# Patient Record
Sex: Female | Born: 1970 | Race: White | Hispanic: No | Marital: Married | Smoking: Never smoker
Health system: Southern US, Community
[De-identification: ages and names within clinical notes are randomized; demographics above are authoritative.]

## PROBLEM LIST (undated history)

## (undated) DIAGNOSIS — R221 Localized swelling, mass and lump, neck: Secondary | ICD-10-CM

## (undated) HISTORY — PX: D&C WITH HYSTEROSCOPY: SHX510231

## (undated) HISTORY — PX: WISDOM TOOTH EXTRACTION: SHX21

---

## 2006-10-24 IMAGING — US US INTRAOPERATIVE - NO REPORT
1 series · 2 of 2 positions shown · non-contrast
Comparison: none

[Series 1: us intraoperative - no report · 2 acquisitions, 2 frames shown]
[im 1/2]
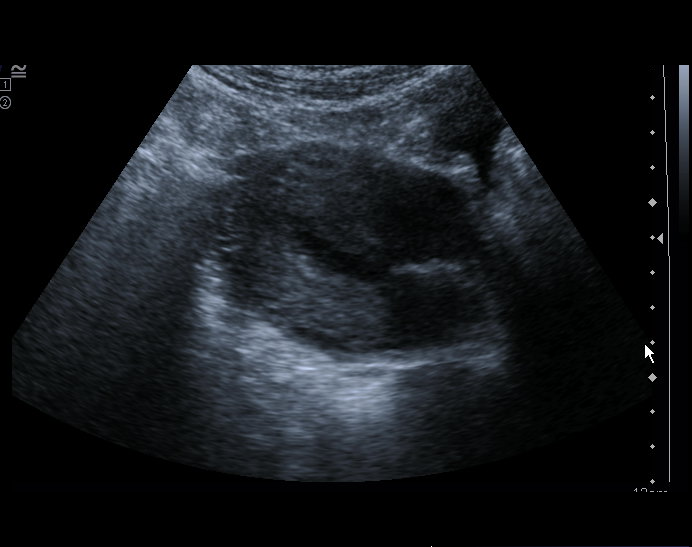
[im 2/2]
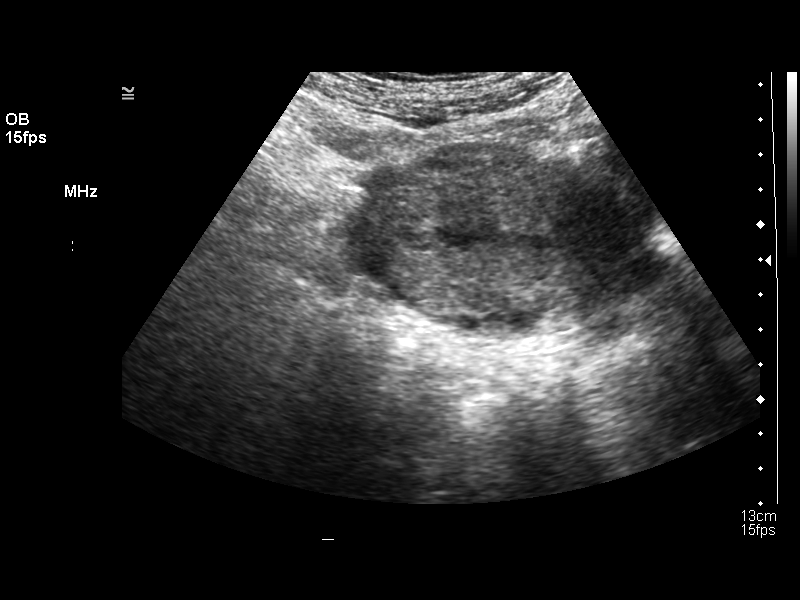

[2 of 2 positions shown; findings below may reference images not displayed]

ULTRASOUND INTRAOPERATIVE:

 Ultrasound was utilized in the operating room by the requesting physician.

## 2015-08-03 NOTE — ED Provider Notes (Signed)
[facility]     Time of Arrival:  08/03/2015 12:49 AM      (S01.511A) Lip laceration, initial encounter    (S02.2XXA) Closed fracture of nasal bone, initial encounter    ED Course/Medical Decision Making:  45 year old otherwise healthy woman who presents after slipping on hardwood floors with a stellate laceration of her upper lip and a suspected nondisplaced nasal bone fracture. We will cleanse and repair the lip laceration, have her follow-up with plastics as needed. UTD with her tetanus.    Disposition:  Home    Laceration Repair  Date/Time: 08/03/2015 1:40 AM  Performed by: Elayne Snare  Authorized by: Christophe Louis E  Consent: Verbal consent obtained. Written consent not obtained.  Risks and benefits: risks, benefits and alternatives were discussed  Consent given by: Patient  Patient identity confirmed: Verbally with patient  Body area: head/neck  Location details: upper lip  Full thickness lip laceration: yes  Vermillion border involved: no  Lip laceration height: up to half vertical height  Laceration length: 4 cm  Foreign bodies: no foreign bodies  Tendon involvement: none  Nerve involvement: none  Vascular damage: no  Anesthesia: nerve block  Local anesthetic: lidocaine 1% with epinephrine  Irrigation solution: saline  Irrigation method: syringe  Amount of cleaning: standard  Debridement: none  Degree of undermining: none  Subcutaneous closure: 5-0 Vicryl  Number of sutures: 4  Technique: complex  Approximation: close  Approximation difficulty: complex  Patient tolerance: Patient tolerated the procedure well with no immediate complications        Discharge Medication List as of 08/03/2015  1:38 AM      START taking these medications    Details   penicillin V potassium (VEETID) 500 mg PO TABS Take 1 Tab by Mouth 4 Times Daily for 5 days., Disp-20 Tab, R-0, Print           Chief Complaint   Patient presents with   . LIP LACERATION     HPI Lindsay Duncan is a 45 y.o.  female is reportedly healthy presents to the emergency department with lip laceration. She was that she was walking at home and slipped on a hardwood floor, falling onto her face. She did not have loss of consciousness. She complains of a laceration to the upper lip that causes pain. Bleeding is controlled. She is up-to-date with her tetanus shot. She denies any dental injury. She is wearing glasses. The bridge of her nose swollen and sore as well.    Review of Systems:  Constitutional: Negative for fever and chills.   Eyes: Negative for visual disturbance.   Respiratory: Negative for cough and shortness of breath.    Cardiovascular: Negative for chest pain and palpitations.   Gastrointestinal: Negative for nausea, vomiting, abdominal pain and diarrhea.   Musculoskeletal: Negative for arthralgias and gait problem.   Skin: Negative for rash.   Neurological: Negative for dizziness, weakness and numbness.   Hematological: does not bruise/bleed easily.   Psychiatric/Behavioral: Negative for sleep disturbance.       History reviewed. No pertinent past medical history.  History reviewed. No pertinent past surgical history.  No family history on file.  Social History     Social History   . Marital Status: Married     Spouse Name: N/A   . Number of Children: N/A   . Years of Education: N/A     Occupational History   . Not on file.     Social  History Main Topics   . Smoking status: Never Smoker    . Smokeless tobacco: Not on file   . Alcohol Use: 10.5 oz/week     0 Standard drinks or equivalent, 21 Glasses of wine per week   . Drug Use: No   . Sexual Activity: Not on file     Other Topics Concern   . Not on file     Social History Narrative     No outpatient prescriptions have been marked as taking for the 08/03/15 encounter [facility] Encounter).     No Known Allergies    Vital Signs:  Patient Vitals for the past 72 hrs:   Heart Rate Resp BP BP Mean SpO2 Weight   08/03/15 0050 98 16 172/67 mmHg 102 MM HG 99 % 66.225 kg (146  lb)       Physical Exam   Constitutional: She is oriented to person, place, and time and well-developed, well-nourished, and in no distress. No distress.   HENT:   Head: Normocephalic and atraumatic.   Mouth/Throat: Oropharynx is clear and moist.   Nose with edema, tenderness to bridge, no septal hematoma or asymmetry. Stellate laceration to upper lip. No dental injury.   Eyes: Conjunctivae and EOM are normal. Pupils are equal, round, and reactive to light. Right eye exhibits no discharge. Left eye exhibits no discharge. No scleral icterus.   Neck: Normal range of motion. Neck supple. No JVD present. No tracheal deviation present. No thyromegaly present.   Cardiovascular: Normal rate, regular rhythm, normal heart sounds and intact distal pulses.  Exam reveals no gallop and no friction rub.    No murmur heard.  Pulmonary/Chest: Effort normal and breath sounds normal. No stridor. No respiratory distress. She has no wheezes. She has no rales. She exhibits no tenderness.   Abdominal: Soft. Bowel sounds are normal. She exhibits no distension and no mass. There is no tenderness. There is no rebound and no guarding.   Musculoskeletal: Normal range of motion. She exhibits no edema or tenderness.   Neurological: She is alert and oriented to person, place, and time. Gait normal. GCS score is 15.   Skin: Skin is warm and dry. No rash noted. She is not diaphoretic. No erythema. No pallor.   Stellate laceration to the upper lip, deep   Psychiatric: Mood and affect normal.   Nursing note and vitals reviewed.      Diagnostics:  Labs:  No results found for this visit on 08/03/15.  No orders to display

## 2021-03-24 ENCOUNTER — Other Ambulatory Visit: Payer: Self-pay | Admitting: Specialist

## 2022-03-25 ENCOUNTER — Other Ambulatory Visit: Payer: Self-pay | Admitting: Family Nurse Practitioner

## 2022-07-15 NOTE — Progress Notes (Signed)
I attest that I have spent 8 - 15 minutes reviewing the TB Assessment Form and counseling the patient about Tuberculosis.

## 2022-07-15 NOTE — Progress Notes (Signed)
Subjective:   Patient ID: Lindsay Duncan is a 52 y.o. female.    Chief Complaint   Patient presents with   . TB Risk Assessment Form         HPI   TB Risk Assessment Form  Reason for TB Assessment::  Pre-employment  Did patient provide form?: No    Does the patient have any symptoms of TB?: No    Does the patient have a history of abnormal chest x-ray?: No    Was the patient born outside of the [place] in a country with high TB rate?: No    Received BCG vaccine?: No    Any recent travel to country with high TB rate for more than a week?: No    Has the patient been exposed to any one with a positive TB skin test, TB disease or taking medications for TB?: No    Has the patient been in close contact with someone born in or traveled to a country with high TB rate?: No    Does patient have immunosuppression due to medications or disease processes?: No    Is the patient an IV drug user?: No    Has the patient ever lived/worked in a prison, jail, homeless shelter or long-term care facility?: No    Does the patient drink raw milk or eat unpasteurized cheese?: No    Does the patient have/suspected to have HIV infection or live with an adult with HIV seropositivity?: No    Risk Assesment positive?: No    Patient able to complete PPD today?: No    Contraindications for TB Screening::  None  Have you ever fainted, nearly fainted or been concerned about fainting after receiving an injection/vaccine?: No             Review of Systems    Social History     Tobacco Use   Smoking Status Not on file   Smokeless Tobacco Not on file     No past medical history on file.  No past surgical history on file.  No family history on file.  Objective:          Physical Exam       Assessment/Plan:    HPI provided by Self    Based on today's visit:history, physical exam and all relevant testing completed in clinic today  patient's visit diagnosis is/includes   1. Screening-pulmonary TB      Patient does not have a history of chronic  conditions.    Treatment plan includes:   Orders Placed:  Orders Placed This Encounter   Procedures   . Basic Health Screening     Medications ordered this visit      No prescriptions requested or ordered in this encounter       Current medication list and any new medications prescribed or recommended today were reviewed with the patient and specific instructions were provided Yes    Provider Recommendations     TB risk assessment screening completed. Results and recommendations reviewed with patient in accordance with [facility] guidelines.     Follow up care instructions were provided and reviewed?with the  Patient. All questions were answered. Patient verbalized understanding of plan of care today.

## 2022-09-04 ENCOUNTER — Emergency Department: Payer: Commercial Managed Care - POS

## 2022-09-04 ENCOUNTER — Emergency Department
Admission: EM | Admit: 2022-09-04 | Discharge: 2022-09-04 | Disposition: A | Discharge status: Home / Self Care | Payer: Commercial Managed Care - POS | Attending: Emergency Medicine | Admitting: Emergency Medicine

## 2022-09-04 DIAGNOSIS — I10 Essential (primary) hypertension: Secondary | ICD-10-CM | POA: Insufficient documentation

## 2022-09-04 DIAGNOSIS — R911 Solitary pulmonary nodule: Secondary | ICD-10-CM

## 2022-09-04 DIAGNOSIS — I493 Ventricular premature depolarization: Secondary | ICD-10-CM | POA: Insufficient documentation

## 2022-09-04 DIAGNOSIS — R9431 Abnormal electrocardiogram [ECG] [EKG]: Secondary | ICD-10-CM

## 2022-09-04 DIAGNOSIS — U071 COVID-19: Secondary | ICD-10-CM

## 2022-09-04 DIAGNOSIS — H10503 Unspecified blepharoconjunctivitis, bilateral: Secondary | ICD-10-CM

## 2022-09-04 HISTORY — DX: Localized swelling, mass and lump, neck: R22.1

## 2022-09-04 LAB — COMPREHENSIVE METABOLIC PANEL
ALT: 32 U/L (ref 0–55)
AST (SGOT): 31 U/L (ref 5–41)
Albumin/Globulin Ratio: 1 (ref 0.9–2.2)
Albumin: 3.9 g/dL (ref 3.5–5.0)
Alkaline Phosphatase: 146 U/L — ABNORMAL HIGH (ref 37–117)
Anion Gap: 9 (ref 5.0–15.0)
BUN: 10 mg/dL (ref 7.0–21.0)
Bilirubin, Total: 0.3 mg/dL (ref 0.2–1.2)
CO2: 25 mEq/L (ref 17–29)
Calcium: 8.6 mg/dL (ref 8.5–10.5)
Chloride: 104 mEq/L (ref 99–111)
Creatinine: 0.8 mg/dL (ref 0.4–1.0)
Globulin: 4 g/dL — ABNORMAL HIGH (ref 2.0–3.6)
Glucose: 89 mg/dL (ref 70–100)
Potassium: 4 mEq/L (ref 3.5–5.3)
Protein, Total: 7.9 g/dL (ref 6.0–8.3)
Sodium: 138 mEq/L (ref 135–145)
eGFR: >60 mL/min/{1.73_m2} (ref >=60)

## 2022-09-04 LAB — CBC AND DIFFERENTIAL
Absolute NRBC: 0 10*3/uL (ref 0.00–0.00)
Basophils Absolute Automated: 0.07 10*3/uL (ref 0.00–0.08)
Basophils Automated: 0.7 %
Eosinophils Absolute Automated: 0.13 10*3/uL (ref 0.00–0.44)
Eosinophils Automated: 1.3 %
Hematocrit: 39.1 % (ref 34.7–43.7)
Hgb: 13.2 g/dL (ref 11.4–14.8)
Immature Granulocytes Absolute: 0.06 10*3/uL (ref 0.00–0.07)
Immature Granulocytes: 0.6 %
Instrument Absolute Neutrophil Count: 7.62 10*3/uL — ABNORMAL HIGH (ref 1.10–6.33)
Lymphocytes Absolute Automated: 1.55 10*3/uL (ref 0.42–3.22)
Lymphocytes Automated: 14.9 %
MCH: 30.1 pg (ref 25.1–33.5)
MCHC: 33.8 g/dL (ref 31.5–35.8)
MCV: 89.3 fL (ref 78.0–96.0)
MPV: 10.5 fL (ref 8.9–12.5)
Monocytes Absolute Automated: 0.97 10*3/uL — ABNORMAL HIGH (ref 0.21–0.85)
Monocytes: 9.3 %
Neutrophils Absolute: 7.62 10*3/uL — ABNORMAL HIGH (ref 1.10–6.33)
Neutrophils: 73.2 %
Nucleated RBC: 0 /100 WBC (ref 0.0–0.0)
Platelets: 248 10*3/uL (ref 142–346)
RBC: 4.38 10*6/uL (ref 3.90–5.10)
RDW: 12 % (ref 11–15)
WBC: 10.4 10*3/uL — ABNORMAL HIGH (ref 3.10–9.50)

## 2022-09-04 LAB — MAGNESIUM: Magnesium: 1.8 mg/dL (ref 1.6–2.6)

## 2022-09-04 LAB — C-REACTIVE PROTEIN: C-Reactive Protein: 4.1 mg/dL — ABNORMAL HIGH (ref 0.0–1.1)

## 2022-09-04 LAB — TSH: TSH: 1 u[IU]/mL (ref 0.35–4.94)

## 2022-09-04 LAB — D-DIMER: D-Dimer: 0.44 ug/mL FEU (ref 0.00–0.60)

## 2022-09-04 LAB — HIGH SENSITIVITY TROPONIN-I: hs Troponin-I: <2.7 ng/L

## 2022-09-04 LAB — SEDIMENTATION RATE: Sed Rate: 83 mm/Hr — ABNORMAL HIGH (ref 0–20)

## 2022-09-04 MED ORDER — MAGNESIUM SULFATE IN D5W 1-5 GM/100ML-% IV SOLN
1.0000 g | Freq: Once | INTRAVENOUS | Status: AC
Start: 2022-09-04 — End: 2022-09-04
  Administered 2022-09-04: 1 g via INTRAVENOUS
  Filled 2022-09-04: qty 100

## 2022-09-04 MED ORDER — IOHEXOL 350 MG/ML IV SOLN
100.0000 mL | Freq: Once | INTRAVENOUS | Status: AC | PRN
Start: 2022-09-04 — End: 2022-09-04
  Administered 2022-09-04: 100 mL via INTRAVENOUS

## 2022-09-04 MED ORDER — SODIUM CHLORIDE 0.9 % IV BOLUS
1000.0000 mL | Freq: Once | INTRAVENOUS | Status: AC
Start: 2022-09-04 — End: 2022-09-04
  Administered 2022-09-04: 1000 mL via INTRAVENOUS

## 2022-09-04 NOTE — ED Provider Notes (Signed)
History     Chief Complaint   Patient presents with    Irregular Heart Beat     52 year old female presents to the emergency department sent in from the urgent care because of irregular heartbeat.  Patient states she recently returned from a trip to [place] and went to the urgent care because of conjunctivitis.  While at the urgent care was diagnosed with purulent conjunctivitis as well as COVID but told that her heart rate was very irregular and that she needs to come to be evaluated.  Patient states that while at the urgent care are blood pressure was normal however now it seems to be elevated because she is a little anxious.  Patient has been evaluated recently for swelling to her thyroid and chest and that she is scheduled to have a CT at some point but has not done so at this point.  Patient denies any chest pain fever chills cough congestion or shortness of breath    The history is provided by the patient and medical records.        Past Medical History:   Diagnosis Date    Lump in throat     near L upper chest       Past Surgical History:   Procedure Laterality Date    D&C WITH HYSTEROSCOPY      14 years ago    WISDOM TOOTH EXTRACTION Bilateral     all four gone       Family History   Problem Relation Age of Onset    Cardiomyopathy Mother 55       Social  Social History     Tobacco Use    Smoking status: Never     Passive exposure: Never    Smokeless tobacco: Never   Vaping Use    Vaping status: Never Used   Substance Use Topics    Alcohol use: Yes     Comment: occasional    Drug use: Never       .     Allergies   Allergen Reactions    Keflex [Cephalexin] Hives    Amoxicillin Other (See Comments)     GI upset    Sulfa Antibiotics Hives       Home Medications       Med List Status: In Progress Set By: Vivia Budge, RN at 09/04/2022 12:14 PM   No Medications          Review of Systems   Constitutional:  Negative for activity change, diaphoresis, fatigue and fever.   Eyes:  Positive for discharge, redness  and itching.   Respiratory: Negative.     Cardiovascular: Negative.    Genitourinary: Negative.    Musculoskeletal: Negative.    Neurological: Negative.    Psychiatric/Behavioral: Negative.     All other systems reviewed and are negative.      Physical Exam    BP: (!) 207/117, Heart Rate: 77, Temp: 98.3 F (36.8 C), Resp Rate: 18, SpO2: 100 %, Weight: 72.4 kg    Physical Exam  Vitals and nursing note reviewed.   Constitutional:       General: She is not in acute distress.     Appearance: Normal appearance. She is normal weight. She is not ill-appearing, toxic-appearing or diaphoretic.   HENT:      Head: Normocephalic and atraumatic.      Nose: Nose normal.   Eyes:      General:  Right eye: Discharge present.         Left eye: Discharge present.  Cardiovascular:      Rate and Rhythm: Tachycardia present.      Heart sounds: Normal heart sounds.      Comments: Multiple ectopic beats  Pulmonary:      Effort: Pulmonary effort is normal.      Breath sounds: Normal breath sounds.   Musculoskeletal:         General: Normal range of motion.      Cervical back: Normal range of motion.   Skin:     General: Skin is warm and dry.      Capillary Refill: Capillary refill takes less than 2 seconds.   Neurological:      General: No focal deficit present.      Mental Status: She is alert and oriented to person, place, and time.   Psychiatric:         Mood and Affect: Mood normal.         Behavior: Behavior normal.           MDM and ED Course     ED Medication Orders (From admission, onward)      Start Ordered     Status Ordering Provider    09/04/22 1327 09/04/22 1327  iohexol (OMNIPAQUE) 350 MG/ML injection 100 mL  IMG once as needed        Route: Intravenous  Ordered Dose: 100 mL       Last MAR action: Imaging Agent Given MEKONNEN, KASEBAOTH    09/04/22 1300 09/04/22 1256  magnesium sulfate 1g in dextrose 5% IVPB (premix)  Once        Route: Intravenous  Ordered Dose: 1 g       Last MAR action: Stopped Calene Paradiso,  Mikyla Schachter J    09/04/22 1215 09/04/22 1210  sodium chloride 0.9 % bolus 1,000 mL  Once        Route: Intravenous  Ordered Dose: 1,000 mL       Last MAR action: Stopped Anthonie Lotito J           Number and Complexity of Problems Addressed (select at least one)    Complexity: High: 1 acute or chronic illness or injury that poses a threat to life or bodily function      Presenting acute/chronic problems: Irregular heartbeat    Differential diagnosis to include but not limited to:   COVID myocarditis  Hyper or hypothyroid  Hypo or hyperkalemia  Hypo or hypermagnesemia  Viral illness  Pulmonary embolus  Pneumonia      Chronic illness impacting care and increasing risk of acute/chronic problems (obesity based on bmi >30, diabetes, hypertension, elderly - 65 or older): N/A    ______________________________________________________________________  Amount/complexity of Data Reviewed   L\  Look below for information    History obtained from another historian -see HPI or here (parent, spouse,  care giver, ems) : N/A    [facility] and found this relevant information: Positive COVID test positive conjunctivitis and irregular heartbeat    Independent interpretation of  EKG:     EKG Interpretation  EKG interpreted independently by me:    Rate: Tachycardic  Rhythm: sinus tachycardia and premature ventricular contractions  Axis: Normal  ST-T Segments: nonspec st-t changes  Conduction: No blocks  Impression: Non-specific EKG sinus tachycardia with bigeminy    No previous EKG to compare  Independent visualized and interpretation of  radiological study by me:     Type of radiological study performed : CT chest  Independent Interpretation by me: No evidence of pulmonary embolus however there are nodules that recommending repeat CT in 6 months    Diagnostic tests appropriately considered even if not ultimately performed: N/A    Discussion of management with other physicians, NP, APP and/or other  caretakers:   Discussion with cardiology on-call and recommendation(s) were follow-up in the office in a week. Patient condition and all pertinent labs and/or radiology studies discussed.     ______________________________________________________________________    Risk of Complications and/or Morbidity or Mortality of Patient Management  (select one if applicable)    Risk: Moderate Risk                               Medical Decision Making  Patient seen and evaluated by me  Case discussed with Dr. Melina Schools  Vital signs reviewed  Pulse ox is normal and requires no intervention  Appropriate PPE worn during examination of patient  Initial questions of patient or family answered to the best of my ability      Amount and/or Complexity of Data Reviewed  Labs: ordered. Decision-making details documented in ED Course.  Radiology: ordered. Decision-making details documented in ED Course.  ECG/medicine tests: ordered and independent interpretation performed. Decision-making details documented in ED Course.    Risk  Prescription drug management.  Risk Details: Results of all testing reviewed with patient  All questions were answered  Patient advised of need to follow-up  Should symptoms fail to progress or improve and is unable to obtain follow-up in a reasonable amount of time, they were advised to return to emergency department for evaluation    This chart was generated by the Epic EMR system/speech recognition and may contain inherent errors or omissions not intended by the user. Grammatical errors, random word insertions, deletions, pronoun errors and incomplete sentences are occasional consequences of this technology due to software limitations. Not all errors are caught or corrected. If there are questions or concerns about the content of this note or information contained within the body of this dictation they should be addressed directly with the author for clarification              ED Course as of 09/04/22 1458    Sat Sep 04, 2022   1448 Significantly less ectopy noted now that patient has received a gram of magnesium she had gone from bigeminy to maybe once every 7 or 8 beats with a PVC.  I did discuss case with cardiology he says once patient has cleared her COVID they be more than happy to see in the office and do a workup for her.  I discussed with them whether or not she would need a beta-blocker and they said at this point if she is asymptomatic that they would defer [AP]      ED Course User Index  [AP] Bethanne Mule, Lyndel Safe, PA             Procedures    Clinical Impression & Disposition     Clinical Impression  Final diagnoses:   COVID-19   Blepharoconjunctivitis of both eyes, unspecified blepharoconjunctivitis type   PVC (premature ventricular contraction)   Hypertension, unspecified type   Pulmonary nodule        ED Disposition       ED Disposition   Discharge    Condition   --  Date/Time   Sat Sep 04, 2022  2:54 PM    Comment   Kaavya Puskarich discharge to home/self care.    Condition at disposition: Stable                  New Prescriptions    No medications on file                   Irwin Brakeman, [place]  09/04/22 1458       Barbaraann Cao, MD  09/05/22 [phone]

## 2022-09-04 NOTE — Progress Notes (Signed)
Subjective:   Patient ID: Lindsay Duncan is a 52 y.o. female.    Chief Complaint   Patient presents with   . Eyes         HPI   Sudden onset of illness on 4/1- developed sore throat, runny nose, fatigue. Had returned one day prior from [place]   Coughing started 4/2-4/3  Fever noted on 4/2 and 4/3. Yesterday had temp 99.   Has had COVID twice before  This AM took Mucinex multi-symptom and half a cup of coffee  Since last night felt like she had something in her eye and this AM awoke with eye lashes matted together\  Cough is productive and comes in fits    Eyes  Associated symptoms: chills, nasal congestion, cough, ear pain, fatigue, headaches, rhinorrhea, postnasal drip, sinus pain, sore throat, discharge and eye symptoms    Associated symptoms: no myalgias, no appetite change, no fever, no chest pain, no shortness of breath, no wheezing, no nausea, no diarrhea and no vomiting             Review of Systems   Constitutional: Positive for chills and fatigue. Negative for appetite change and fever.   HENT: Positive for congestion, ear pain, postnasal drip, rhinorrhea, sinus pressure, sinus pain and sore throat.    Eyes: Positive for discharge and itching.   Respiratory: Positive for cough. Negative for shortness of breath and wheezing.    Cardiovascular: Negative for chest pain.   Gastrointestinal: Negative for diarrhea, nausea and vomiting.   Musculoskeletal: Negative for myalgias.   Neurological: Positive for headaches.       Social History     Tobacco Use   Smoking Status Not on file   Smokeless Tobacco Not on file     No past medical history on file.  No past surgical history on file.  No family history on file.  Objective:          Physical Exam       Assessment/Plan:    HPI provided by Self    Based on today's visit:history, physical exam and all relevant testing completed in clinic today  patient's visit diagnosis is/includes   1. Contact with and (suspected) exposure to covid-19    2. Fever,  unspecified fever cause    3. Cardiac arrhythmia, unspecified cardiac arrhythmia type    4. Lab test positive for detection of COVID-19 virus    5. Other Mucopurulent conjunctivitis, left eye[H10.022]      Patient does not have a history of chronic conditions.    Treatment plan includes:   Orders Placed:  Orders Placed This Encounter   Procedures   . Influenza Digital Immunoassay POCT   . LumiraDX SARS-COV-2 Rapid Result Antigen Test     Medications ordered this visit     Signed Prescriptions Disp Refills   . moxifloxacin (Vigamox) 0.5 % ophthalmic solution 3 mL 0     Sig: Administer 1 drop into the left eye 3 (three) times a day for 7 days       Current medication list and any new medications prescribed or recommended today were reviewed with the patient and specific instructions were provided Not applicable    Provider Recommendations   Referred to UC or ED for EKG for arrhythmia but she is astymptomatic at this time. Will likely go to [facility]    Follow up care instructions were provided and reviewed?with the  Patient. All questions were answered. Patient verbalized understanding of  plan of care today.           I have verified the patient has a positive COVID-19 test.  Enter type of test: Antigen  Enter name of test: Debroah Baller  Enter date of test: 09/04/2022  Unable to validate, COVID 19 test required: Yes    State Department of Health notified of results per regulations

## 2022-09-04 NOTE — Discharge Instructions (Addendum)
We discussed the results of all your testing to include your inflammatory markers that are somewhat elevated.  This is most likely related to COVID.  Since you are not feeling any symptoms regarding the PVCs and discussion with cardiology we will defer starting you on any beta-blockers to help with this.  Please call cardiology on Monday to schedule an appointment for evaluation within a week or as soon as your COVID resolves

## 2022-09-05 LAB — ECG 12-LEAD
Atrial Rate: 102 {beats}/min
P Axis: 48 degrees
P-R Interval: 126 ms
Q-T Interval: 346 ms
QRS Duration: 72 ms
QTC Calculation (Bezet): 450 ms
R Axis: -7 degrees
T Axis: 3 degrees
Ventricular Rate: 102 {beats}/min

## 2023-05-22 ENCOUNTER — Other Ambulatory Visit (INDEPENDENT_AMBULATORY_CARE_PROVIDER_SITE_OTHER): Payer: Self-pay
